# Patient Record
Sex: Female | Born: 1975 | Race: White | Hispanic: No | Marital: Married | Smoking: Never smoker
Health system: Southern US, Community
[De-identification: ages and names within clinical notes are randomized; demographics above are authoritative.]

## PROBLEM LIST (undated history)

## (undated) DIAGNOSIS — I10 Essential (primary) hypertension: Secondary | ICD-10-CM

---

## 2018-11-27 ENCOUNTER — Other Ambulatory Visit: Payer: Self-pay

## 2018-11-27 ENCOUNTER — Emergency Department (INDEPENDENT_AMBULATORY_CARE_PROVIDER_SITE_OTHER)
Admission: EM | Admit: 2018-11-27 | Discharge: 2018-11-27 | Disposition: A | Discharge status: Home / Self Care | Payer: No Typology Code available for payment source | Source: Home / Self Care | Attending: Family Medicine | Admitting: Family Medicine

## 2018-11-27 ENCOUNTER — Emergency Department (INDEPENDENT_AMBULATORY_CARE_PROVIDER_SITE_OTHER): Payer: No Typology Code available for payment source

## 2018-11-27 ENCOUNTER — Encounter: Payer: Self-pay | Admitting: Emergency Medicine

## 2018-11-27 DIAGNOSIS — M25562 Pain in left knee: Secondary | ICD-10-CM

## 2018-11-27 DIAGNOSIS — S62112A Displaced fracture of triquetrum [cuneiform] bone, left wrist, initial encounter for closed fracture: Secondary | ICD-10-CM

## 2018-11-27 DIAGNOSIS — S8002XA Contusion of left knee, initial encounter: Secondary | ICD-10-CM

## 2018-11-27 HISTORY — DX: Essential (primary) hypertension: I10

## 2018-11-27 MED ORDER — HYDROCODONE-ACETAMINOPHEN 5-325 MG PO TABS: 1.0000 | ORAL_TABLET | Freq: Four times a day (QID) | ORAL | 0 refills | Status: DC | PRN

## 2018-11-27 NOTE — ED Triage Notes (Signed)
Left wrist, left knee

## 2018-11-27 NOTE — Discharge Instructions (Addendum)
Wear ace wrap on left knee.  Wear left wrist brace. Elevate left wrist when possible.   Apply ice pack for 20 to 30 minutes, 3 to 4 times daily  Continue until pain and swelling decrease.

## 2018-11-27 NOTE — ED Provider Notes (Signed)
[facility]    CSN: [id number] Arrival date & time: 11/27/18  0945      History   Chief Complaint Chief Complaint  Patient presents with   Fall    HPI Debra Simmons is a 43 y.o. female.   While speed skating yesterday, patient fell, landing on her left anterior knee and bracing with her left wrist.  She has had persistent left wrist pain and decreased range of motion, and improving pain in her knee.  The history is provided by the patient.  Wrist Pain This is a new problem. The current episode started yesterday. The problem occurs constantly. The problem has been gradually worsening. Associated symptoms comments: Soreness left anterior knee.. Exacerbated by: wrist movement. Nothing relieves the symptoms. She has tried nothing (Velcro wrist brace) for the symptoms. The treatment provided mild relief.    Past Medical History:  Diagnosis Date   Hypertension     There are no active problems to display for this patient.   History reviewed. No pertinent surgical history.  OB History   No obstetric history on file.      Home Medications    Prior to Admission medications   Medication Sig Start Date End Date Taking? Authorizing Provider  HYDROcodone-acetaminophen (NORCO/VICODIN) 5-325 MG tablet Take 1 tablet by mouth every 6 (six) hours as needed for moderate pain or severe pain. 11/27/18   Kandra Nicolas, MD    Family History Family History  Problem Relation Age of Onset   Hypertension Mother    Hypertension Father     Social History Social History   Tobacco Use   Smoking status: Never Smoker   Smokeless tobacco: Never Used  Substance Use Topics   Alcohol use: Yes   Drug use: Not on file     Allergies   Patient has no known allergies.   Review of Systems Review of Systems  Constitutional: Negative.   Musculoskeletal: Positive for joint swelling. Negative for back pain and neck pain.  All other systems reviewed and are  negative.    Physical Exam Triage Vital Signs ED Triage Vitals [11/27/18 1108]  Enc Vitals Group     BP 131/82     Pulse Rate 73     Resp      Temp 98.8 F (37.1 C)     Temp Source Oral     SpO2 99 %     Weight 180 lb (81.6 kg)     Height 5\' 1"  (1.549 m)     Head Circumference      Peak Flow      Pain Score 8     Pain Loc      Pain Edu?      Excl. in [place]?    No data found.  Updated Vital Signs BP 131/82 (BP Location: Right Arm)    Pulse 73    Temp 98.8 F (37.1 C) (Oral)    Ht 5\' 1"  (1.549 m)    Wt 81.6 kg    SpO2 99%    BMI 34.01 kg/m   Visual Acuity Right Eye Distance:   Left Eye Distance:   Bilateral Distance:    Right Eye Near:   Left Eye Near:    Bilateral Near:     Physical Exam Vitals signs and nursing note reviewed.  Constitutional:      General: She is not in acute distress.    Appearance: She is obese.  HENT:     Head: Atraumatic.  Nose: Nose normal.  Eyes:     Pupils: Pupils are equal, round, and reactive to light.  Neck:     Musculoskeletal: Normal range of motion.  Cardiovascular:     Rate and Rhythm: Normal rate.  Pulmonary:     Effort: Pulmonary effort is normal.  Musculoskeletal:     Left knee: She exhibits normal range of motion, no swelling, no effusion, no ecchymosis, no deformity, no laceration, no erythema, normal alignment and no LCL laxity. Tenderness found.     Left hand: She exhibits decreased range of motion, tenderness, bony tenderness and swelling. She exhibits normal two-point discrimination, normal capillary refill, no deformity and no laceration. Normal sensation noted.       Hands:     Right lower leg: No edema.     Left lower leg: No edema.       Legs:     Comments: Left wrist has dorsal tenderness to palpation as noted on diagram.   Left knee:  No effusion, erythema, or warmth.  Knee stable, negative drawer test.  McMurray test negative. There is mild tenderness to palpation over inferior patella as noted on  diagram.   Skin:    General: Skin is warm and dry.  Neurological:     Mental Status: She is alert.      UC Treatments / Results  Labs (all labs ordered are listed, but only abnormal results are displayed) Labs Reviewed - No data to display  EKG   Radiology No results found.  Procedures Procedures (including critical care time)  Medications Ordered in UC Medications - No data to display  Initial Impression / Assessment and Plan / UC Course  I have reviewed the triage vital signs and the nursing notes.  Pertinent labs & imaging results that were available during my care of the patient were reviewed by me and considered in my medical decision making (see chart for details).    Ace wrap applied to left knee.  Patient's velcro wrist splint appears adequate. Schedule appt with Dr. Rodney Simmons in one week for fracture management. Rx for Lortab (#10, no refill). Controlled Substance Prescriptions I have consulted the [place] Controlled Substances Registry for this patient, and feel the risk/benefit ratio today is favorable for proceeding with this prescription for a controlled substance.    Final Clinical Impressions(s) / UC Diagnoses   Final diagnoses:  Contusion of left knee, initial encounter  Closed displaced fracture of triquetrum of left wrist, initial encounter     Discharge Instructions     Wear ace wrap on left knee.  Wear left wrist brace. Elevate left wrist when possible.   Apply ice pack for 20 to 30 minutes, 3 to 4 times daily  Continue until pain and swelling decrease.      ED Prescriptions    Medication Sig Dispense Auth. Provider   HYDROcodone-acetaminophen (NORCO/VICODIN) 5-325 MG tablet Take 1 tablet by mouth every 6 (six) hours as needed for moderate pain or severe pain. 10 tablet Lattie Haw, MD        Lattie Haw, MD 12/01/18 [id number]

## 2018-12-03 ENCOUNTER — Telehealth: Payer: Self-pay

## 2018-12-03 NOTE — Telephone Encounter (Signed)
Pt called and said that arm is still hurting.  Pt advised to follow up with Dr T for further treatment of fracture.  Pt has an appointment tomorrow.

## 2018-12-04 ENCOUNTER — Ambulatory Visit (INDEPENDENT_AMBULATORY_CARE_PROVIDER_SITE_OTHER): Payer: No Typology Code available for payment source | Admitting: Sports Medicine

## 2018-12-04 ENCOUNTER — Ambulatory Visit (INDEPENDENT_AMBULATORY_CARE_PROVIDER_SITE_OTHER): Payer: No Typology Code available for payment source

## 2018-12-04 ENCOUNTER — Other Ambulatory Visit: Payer: Self-pay

## 2018-12-04 ENCOUNTER — Encounter: Payer: Self-pay | Admitting: Sports Medicine

## 2018-12-04 DIAGNOSIS — S62115A Nondisplaced fracture of triquetrum [cuneiform] bone, left wrist, initial encounter for closed fracture: Secondary | ICD-10-CM

## 2018-12-04 DIAGNOSIS — S62112A Displaced fracture of triquetrum [cuneiform] bone, left wrist, initial encounter for closed fracture: Secondary | ICD-10-CM | POA: Insufficient documentation

## 2018-12-04 MED ORDER — OXYCODONE-ACETAMINOPHEN 5-325 MG PO TABS: 1.0000 | ORAL_TABLET | Freq: Three times a day (TID) | ORAL | 0 refills | Status: DC | PRN

## 2018-12-04 NOTE — Progress Notes (Signed)
Subjective:    CC: Wrist fracture  HPI:  This is a pleasant 43 year old female, she recently took a misstep and had a fall, she had immediate pain, swelling over her dorsal left wrist, pain is moderate, persistent, localized without radiation, she was seen in urgent care, x-rays showed an avulsion fracture from the dorsal triquetrum, she was appropriately placed in a Velcro brace and referred to me for further evaluation and definitive treatment.  I reviewed the past medical history, family history, social history, surgical history, and allergies today and no changes were needed.  Please see the problem list section below in epic for further details.  Past Medical History: Past Medical History:  Diagnosis Date  . Hypertension    Past Surgical History: No past surgical history on file. Social History: Social History   Socioeconomic History  . Marital status: Married    Spouse name: Not on file  . Number of children: Not on file  . Years of education: Not on file  . Highest education level: Not on file  Occupational History  . Not on file  Social Needs  . Financial resource strain: Not on file  . Food insecurity    Worry: Not on file    Inability: Not on file  . Transportation needs    Medical: Not on file    Non-medical: Not on file  Tobacco Use  . Smoking status: Never Smoker  . Smokeless tobacco: Never Used  Substance and Sexual Activity  . Alcohol use: Yes  . Drug use: Not on file  . Sexual activity: Not on file  Lifestyle  . Physical activity    Days per week: Not on file    Minutes per session: Not on file  . Stress: Not on file  Relationships  . Social Musician on phone: Not on file    Gets together: Not on file    Attends religious service: Not on file    Active member of club or organization: Not on file    Attends meetings of clubs or organizations: Not on file    Relationship status: Not on file  Other Topics Concern  . Not on file   Social History Narrative  . Not on file   Family History: Family History  Problem Relation Age of Onset  . Hypertension Mother   . Hypertension Father    Allergies: No Known Allergies Medications: See med rec.  Review of Systems: No headache, visual changes, nausea, vomiting, diarrhea, constipation, dizziness, abdominal pain, skin rash, fevers, chills, night sweats, swollen lymph nodes, weight loss, chest pain, body aches, joint swelling, muscle aches, shortness of breath, mood changes, visual or auditory hallucinations.  Objective:    General: Well Developed, well nourished, and in no acute distress.  Neuro: Alert and oriented x3, extra-ocular muscles intact, sensation grossly intact.  HEENT: Normocephalic, atraumatic, pupils equal round reactive to light, neck supple, no masses, no lymphadenopathy, thyroid nonpalpable.  Skin: Warm and dry, no rashes noted.  Cardiac: Regular rate and rhythm, no murmurs rubs or gallops.  Respiratory: Clear to auscultation bilaterally. Not using accessory muscles, speaking in full sentences.  Abdominal: Soft, nontender, nondistended, positive bowel sounds, no masses, no organomegaly.  Left wrist: ROM smooth and normal with good flexion and extension and ulnar/radial deviation that is symmetrical with opposite wrist. Tender to palpation over the dorsal radiocarpal joint, significant swelling and bruising. No snuffbox tenderness. No tenderness over Canal of [place]. Strength 5/5 in all directions without pain.  Negative tinel's and phalens signs. Negative Finkelstein sign. Negative Watson's test.  X-rays personally reviewed, there is a dorsal avulsion from the triquetrum.  Impression and Recommendations:    The patient was counselled, risk factors were discussed, anticipatory guidance given.  Fracture of triquetrum of left wrist Close dorsal avulsion fracture of the left triquetrum, 1 week ago. Still swollen so we will do another week and then  place a cast. Increasing to oxycodone. Light duty at work. Repeating x-rays today, she has some pain over the triquetrum but also more distally over the fourth and fifth metacarpal shafts.  I billed a fracture code for this encounter, all subsequent visits will be post-op checks in the global period.   ___________________________________________ Gwen Her. Dianah Field, M.D., ABFM., CAQSM. Primary Care and Sports Medicine [facility]  Adjunct Professor of [place] [employer]

## 2018-12-04 NOTE — Assessment & Plan Note (Addendum)
Close dorsal avulsion fracture of the left triquetrum, 1 week ago. Still swollen so we will do another week and then place a cast. Increasing to oxycodone. Light duty at work. Repeating x-rays today, she has some pain over the triquetrum but also more distally over the fourth and fifth metacarpal shafts.  I billed a fracture code for this encounter, all subsequent visits will be post-op checks in the global period.

## 2018-12-11 ENCOUNTER — Ambulatory Visit (INDEPENDENT_AMBULATORY_CARE_PROVIDER_SITE_OTHER): Payer: No Typology Code available for payment source | Admitting: Sports Medicine

## 2018-12-11 ENCOUNTER — Other Ambulatory Visit: Payer: Self-pay

## 2018-12-11 DIAGNOSIS — S62115D Nondisplaced fracture of triquetrum [cuneiform] bone, left wrist, subsequent encounter for fracture with routine healing: Secondary | ICD-10-CM | POA: Diagnosis not present

## 2018-12-11 NOTE — Assessment & Plan Note (Addendum)
Continue pain medications, light duty, short arm cast placed today. She still has a bit of paresthesias over her left knee and pain in her left thumb, nothing abnormal on the x-rays.  I advised her we can just watch this for now and that neurapraxias generally resolve. Return in 1 month for cast removal.

## 2018-12-11 NOTE — Progress Notes (Signed)
  Subjective: Debra Simmons returns, she pleasant 43 year old female with a left triquetral fracture, this occurred approximately 2 weeks ago.  She still has a bit of paresthesias over her left knee and pain in her left thumb, nothing abnormal on the x-rays.  I advised her we can just watch this for now and that neurapraxias generally resolve.  Objective: General: Well-developed, well-nourished, and in no acute distress. Left wrist: Swollen, still tender over the dorsal radiocarpal joint.  Short arm cast applied.  Assessment/plan:   Fracture of triquetrum of left wrist Continue pain medications, light duty, short arm cast placed today. She still has a bit of paresthesias over her left knee and pain in her left thumb, nothing abnormal on the x-rays.  I advised her we can just watch this for now and that neurapraxias generally resolve. Return in 1 month for cast removal.    ___________________________________________ Gwen Her. Dianah Field, M.D., ABFM., CAQSM. Primary Care and Sports Medicine [facility]  Adjunct Professor of [place] [employer] of Medicine

## 2018-12-30 ENCOUNTER — Ambulatory Visit (INDEPENDENT_AMBULATORY_CARE_PROVIDER_SITE_OTHER): Payer: No Typology Code available for payment source | Admitting: Sports Medicine

## 2018-12-30 ENCOUNTER — Other Ambulatory Visit: Payer: Self-pay

## 2018-12-30 ENCOUNTER — Telehealth: Payer: Self-pay | Admitting: Physician Assistant

## 2018-12-30 ENCOUNTER — Ambulatory Visit (INDEPENDENT_AMBULATORY_CARE_PROVIDER_SITE_OTHER): Payer: No Typology Code available for payment source

## 2018-12-30 DIAGNOSIS — S62115D Nondisplaced fracture of triquetrum [cuneiform] bone, left wrist, subsequent encounter for fracture with routine healing: Secondary | ICD-10-CM

## 2018-12-30 MED ORDER — OXYCODONE-ACETAMINOPHEN 10-325 MG PO TABS: 1.0000 | ORAL_TABLET | ORAL | 0 refills | Status: DC | PRN

## 2018-12-30 NOTE — Telephone Encounter (Signed)
Patient stopped by after she had an appointment this afternoon and stated that you had mentioned giving her a stronger pain medication and states she said no at first but now she is re-thinking that and would like to see if she could get prescribed something stronger. Please Advise.

## 2018-12-30 NOTE — Telephone Encounter (Signed)
No problem, switching to oxycodone tens

## 2018-12-30 NOTE — Progress Notes (Signed)
  Subjective: Worsening of wrist pain, I placed [place] in a short arm cast approximately 3 weeks ago, she had a fracture of her triquetrum.  She was doing well until she went to raise her arm, she had immediate increase in pain, as well as swelling.  Her fingers are now tingling.  Objective: General: Well-developed, well-nourished, and in no acute distress. Left wrist: Does appear more swollen than at the last visit, fingers are somewhat tense.  The cast was valved.  X-rays personally reviewed and show persistence of an ununited triquetral fracture.   Only minimally displaced  Assessment/plan:   Fracture of triquetrum of left wrist 3 weeks post fracture, severe worsening of swelling, pain. Neurovascularly intact distally. Cast valved. MRI and x-ray today.    ___________________________________________ Gwen Her. Dianah Field, M.D., ABFM., CAQSM. Primary Care and Sports Medicine [facility]  Adjunct Professor of [place] [employer] [facility] [facility] [employer]

## 2018-12-30 NOTE — Assessment & Plan Note (Addendum)
3 weeks post fracture, severe worsening of swelling, pain. Neurovascularly intact distally. Cast valved. MRI and x-ray today.

## 2019-01-01 ENCOUNTER — Ambulatory Visit (INDEPENDENT_AMBULATORY_CARE_PROVIDER_SITE_OTHER): Payer: No Typology Code available for payment source | Admitting: Sports Medicine

## 2019-01-01 ENCOUNTER — Other Ambulatory Visit: Payer: Self-pay

## 2019-01-01 ENCOUNTER — Encounter: Payer: Self-pay | Admitting: Sports Medicine

## 2019-01-01 DIAGNOSIS — S62115D Nondisplaced fracture of triquetrum [cuneiform] bone, left wrist, subsequent encounter for fracture with routine healing: Secondary | ICD-10-CM

## 2019-01-01 DIAGNOSIS — M8580 Other specified disorders of bone density and structure, unspecified site: Secondary | ICD-10-CM | POA: Diagnosis not present

## 2019-01-01 NOTE — Assessment & Plan Note (Addendum)
Noted in the lunate incidentally on MRI, she does have some copious inflammation, synovitis. Adding a full panel of rheumatoid testing. On further questioning she does have significant polyarthralgias, morning stiffness.

## 2019-01-01 NOTE — Progress Notes (Signed)
Subjective:    CC: Recheck left wrist  HPI: This is a pleasant 43 year old female, she has a known triquetral fracture, she had a reinjury so we obtained an MRI, the results of which we dictated below.  Pain is severe, persistent, localized without radiation.  On further questioning she has moderate polyarthralgias, achiness, morning stiffness.  I reviewed the past medical history, family history, social history, surgical history, and allergies today and no changes were needed.  Please see the problem list section below in epic for further details.  Past Medical History: Past Medical History:  Diagnosis Date   Hypertension    Past Surgical History: No past surgical history on file. Social History: Social History   Socioeconomic History   Marital status: Married    Spouse name: Not on file   Number of children: Not on file   Years of education: Not on file   Highest education level: Not on file  Occupational History   Not on file  Tobacco Use   Smoking status: Never Smoker   Smokeless tobacco: Never Used  Substance and Sexual Activity   Alcohol use: Yes   Drug use: Not on file   Sexual activity: Not on file  Other Topics Concern   Not on file  Social History Narrative   Not on file   Social Determinants of Health   Financial Resource Strain:    Difficulty of Paying Living Expenses: Not on file  Food Insecurity:    Worried About [place] in the Last Year: Not on file   Ran Out of Food in the Last Year: Not on file  Transportation Needs:    Lack of Transportation (Medical): Not on file   Lack of Transportation (Non-Medical): Not on file  Physical Activity:    Days of Exercise per Week: Not on file   Minutes of Exercise per Session: Not on file  Stress:    Feeling of Stress : Not on file  Social Connections:    Frequency of Communication with Friends and Family: Not on file   Frequency of Social Gatherings with Friends and  Family: Not on file   Attends Religious Services: Not on file   Active Member of Clubs or Organizations: Not on file   Attends Archivist Meetings: Not on file   Marital Status: Not on file   Family History: Family History  Problem Relation Age of Onset   Hypertension Mother    Hypertension Father    Allergies: No Known Allergies Medications: See med rec.  Review of Systems: No fevers, chills, night sweats, weight loss, chest pain, or shortness of breath.   Objective:    General: Well Developed, well nourished, and in no acute distress.  Neuro: Alert and oriented x3, extra-ocular muscles intact, sensation grossly intact.  HEENT: Normocephalic, atraumatic, pupils equal round reactive to light, neck supple, no masses, no lymphadenopathy, thyroid nonpalpable.  Skin: Warm and dry, no rashes. Cardiac: Regular rate and rhythm, no murmurs rubs or gallops, no lower extremity edema.  Respiratory: Clear to auscultation bilaterally. Not using accessory muscles, speaking in full sentences. Left wrist: Swollen and tender.  MRI shows expected triquetral fracture, no additional displacement, she also has a periarticular erosion in the lunate, as well as extensive synovitis concerning for inflammatory arthropathy.  Short arm Exos cast placed.  Impression and Recommendations:    Fracture of triquetrum of left wrist 3 weeks post fracture, she had a reinjury, MRI shows stability of the fracture. We  are transitioning into an Exos cast.  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 Adding a full panel of rheumatoid testing.   ___________________________________________ Ihor Austin. Benjamin Stain, M.D., ABFM., CAQSM. Primary Care and Sports Medicine [place] [facility]  Adjunct Professor of Family Medicine  [employer]

## 2019-01-01 NOTE — Assessment & Plan Note (Signed)
3 weeks post fracture, she had a reinjury, MRI shows stability of the fracture. We are transitioning into an Exos cast.

## 2019-01-08 ENCOUNTER — Ambulatory Visit: Payer: No Typology Code available for payment source | Admitting: Sports Medicine

## 2019-01-08 LAB — ANALYZER(R)ANA IFA WITH REFLEX TITER/PATTRN,SYS AUTOIMM PNL1
14-3-3 eta Protein: <0.2 ng/mL (ref <0.2)
Anti Nuclear Antibody (ANA): NEGATIVE
Anticardiolipin IgA: <11 [APL'U]
Anticardiolipin IgG: <14 [GPL'U]
Anticardiolipin IgM: <12 [MPL'U]
Beta-2 Glyco 1 IgA: <9 SAU (ref <=20)
Beta-2 Glyco 1 IgM: <9 SMU (ref <=20)
Beta-2 Glyco I IgG: <9 SGU (ref <=20)
C3 Complement: 167 mg/dL (ref 83–193)
C4 Complement: 31 mg/dL (ref 15–57)
Centromere Ab Screen: <1 AI
Chromatin (Nucleosomal) Antibody: <1 AI
Cyclic Citrullin Peptide Ab: <16 Units
DNA Ab (DS) Crithidia, IFA: NEGATIVE
ENA SM Ab Ser-aCnc: <1 AI
Jo-1 Autoabs: <1 AI
Ribonucleic Protein(ENA) Antibody, IgG: <1 AI
SM/RNP: <1 AI
SSA (Ro) (ENA) Antibody, IgG: <1 AI
SSB (La) (ENA) Antibody, IgG: <1 AI
Scleroderma (Scl-70) (ENA) Antibody, IgG: <1 AI
Thyroperoxidase Ab SerPl-aCnc: 2 IU/mL (ref <9)

## 2019-01-15 ENCOUNTER — Ambulatory Visit (INDEPENDENT_AMBULATORY_CARE_PROVIDER_SITE_OTHER): Payer: No Typology Code available for payment source | Admitting: Sports Medicine

## 2019-01-15 ENCOUNTER — Other Ambulatory Visit: Payer: Self-pay

## 2019-01-15 DIAGNOSIS — M8580 Other specified disorders of bone density and structure, unspecified site: Secondary | ICD-10-CM

## 2019-01-15 DIAGNOSIS — S62115D Nondisplaced fracture of triquetrum [cuneiform] bone, left wrist, subsequent encounter for fracture with routine healing: Secondary | ICD-10-CM

## 2019-01-15 MED ORDER — OXYCODONE-ACETAMINOPHEN 5-325 MG PO TABS: 1.0000 | ORAL_TABLET | Freq: Three times a day (TID) | ORAL | 0 refills | Status: AC | PRN

## 2019-01-15 MED ORDER — PREDNISONE 50 MG PO TABS: ORAL_TABLET | ORAL | 0 refills | Status: AC

## 2019-01-15 MED ORDER — MELOXICAM 15 MG PO TABS: ORAL_TABLET | ORAL | 3 refills | Status: AC

## 2019-01-15 NOTE — Assessment & Plan Note (Signed)
Debra Simmons returns, she still has significant pain in her wrist, but mostly at the radial aspect. I think the triquetral fracture is healing, she has significantly less tenderness, she has now been in immobilization for 5 weeks. We will continue to watch this.

## 2019-01-15 NOTE — Progress Notes (Signed)
    Procedures performed today:    None.  Independent interpretation of tests performed by another provider:   None.  Impression and Recommendations:    Fracture of triquetrum of left wrist Debra Simmons returns, she still has significant pain in her wrist, but mostly at the radial aspect. I think the triquetral fracture is healing, she has significantly less tenderness, she has now been in immobilization for 5 weeks. We will continue to watch this.   Erosion of bone We incidentally noted an erosion on the lunate on the MRI, also with copious synovitis. I did a full panel of rheumatoid testing, her CCP was negative, ANA as well as follow-up antibodies were also negative. Rheumatoid factor was canceled but rheumatoid factor is not very sensitive for rheumatoid arthritis itself. Because she still has severe pain, swelling, warmth we are going to proceed with a burst of prednisone though this may delay her fracture healing by a week or 2. Often times we will see patients with this presentation with seronegative rheumatoid arthritis. We will then chase the prednisone with meloxicam.     ___________________________________________ Ihor Austin. Benjamin Stain, M.D., ABFM., CAQSM. Primary Care and Sports Medicine [place] [facility]  Adjunct Instructor of Family Medicine  [employer]

## 2019-01-15 NOTE — Assessment & Plan Note (Addendum)
We incidentally noted an erosion on the lunate on the MRI, also with copious synovitis. I did a full panel of rheumatoid testing, her CCP was negative, ANA as well as follow-up antibodies were also negative. Rheumatoid factor was canceled but rheumatoid factor is not very sensitive for rheumatoid arthritis itself. Because she still has severe pain, swelling, warmth we are going to proceed with a burst of prednisone though this may delay her fracture healing by a week or 2. Often times we will see patients with this presentation with seronegative rheumatoid arthritis. We will then chase the prednisone with meloxicam.

## 2019-01-27 ENCOUNTER — Ambulatory Visit: Payer: No Typology Code available for payment source | Admitting: Sports Medicine

## 2019-02-03 ENCOUNTER — Ambulatory Visit: Payer: No Typology Code available for payment source | Admitting: Sports Medicine

## 2020-11-19 IMAGING — DX DG WRIST COMPLETE 3+V*L*
4 series · 4 of 4 positions shown · non-contrast
Comparison: None.

CLINICAL DATA: Left wrist pain since a fall skating last week.
Initial encounter.

EXAM:
LEFT WRIST - COMPLETE 3+ VIEW

[wrist pa]
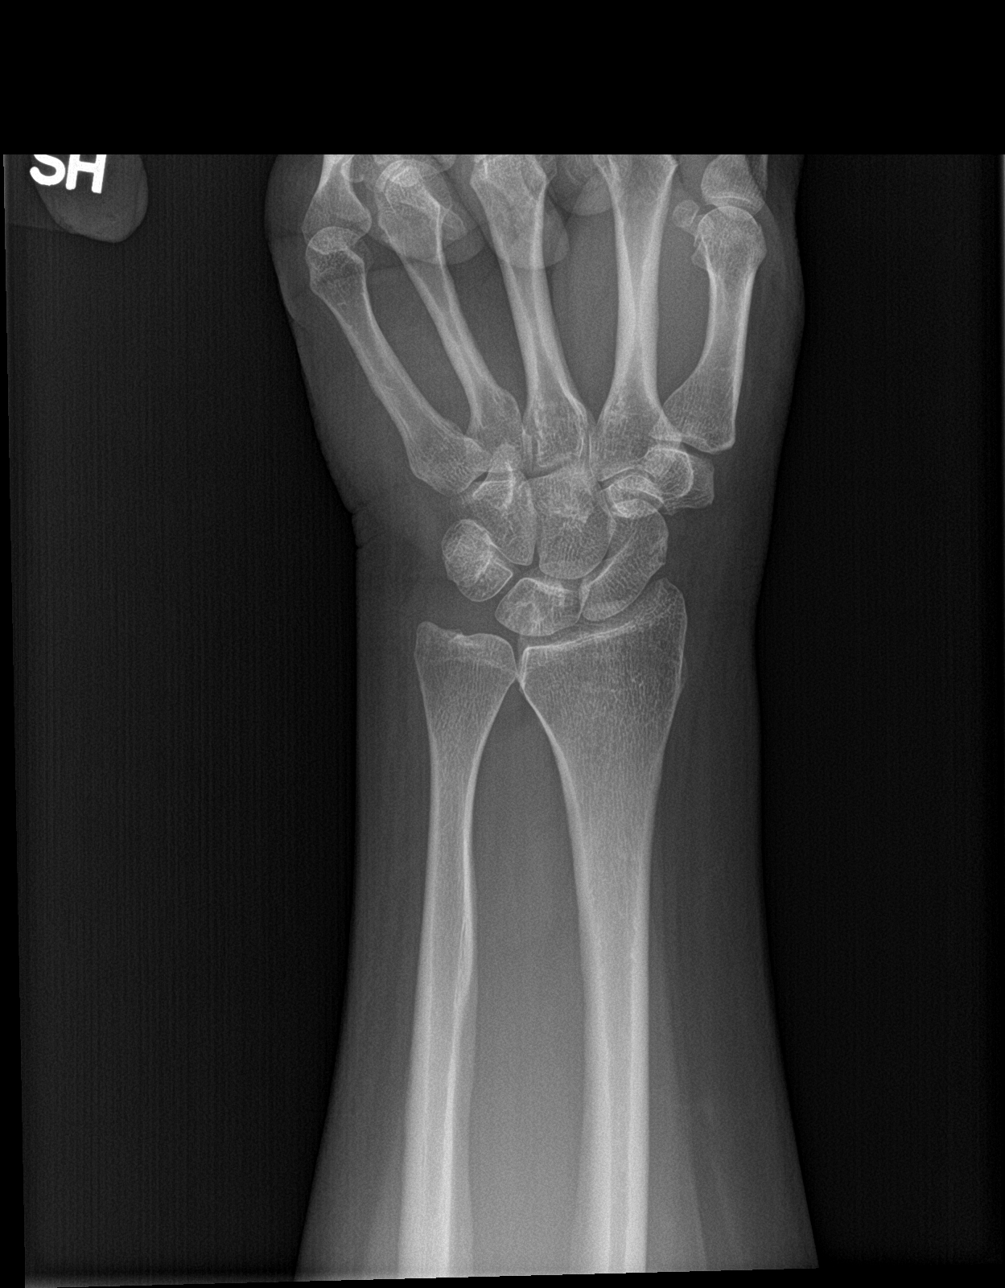

[wrist obl]
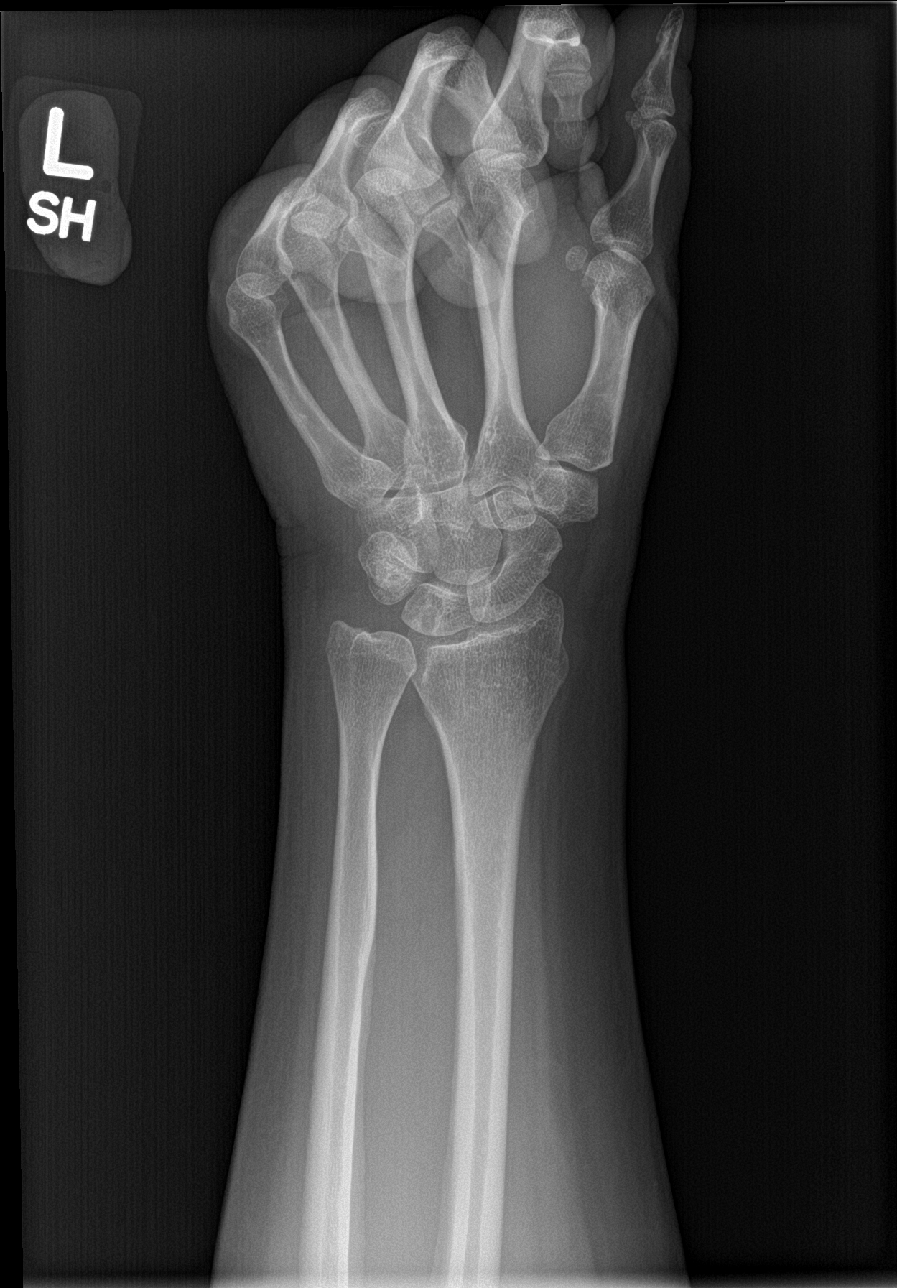

[wrist lat]
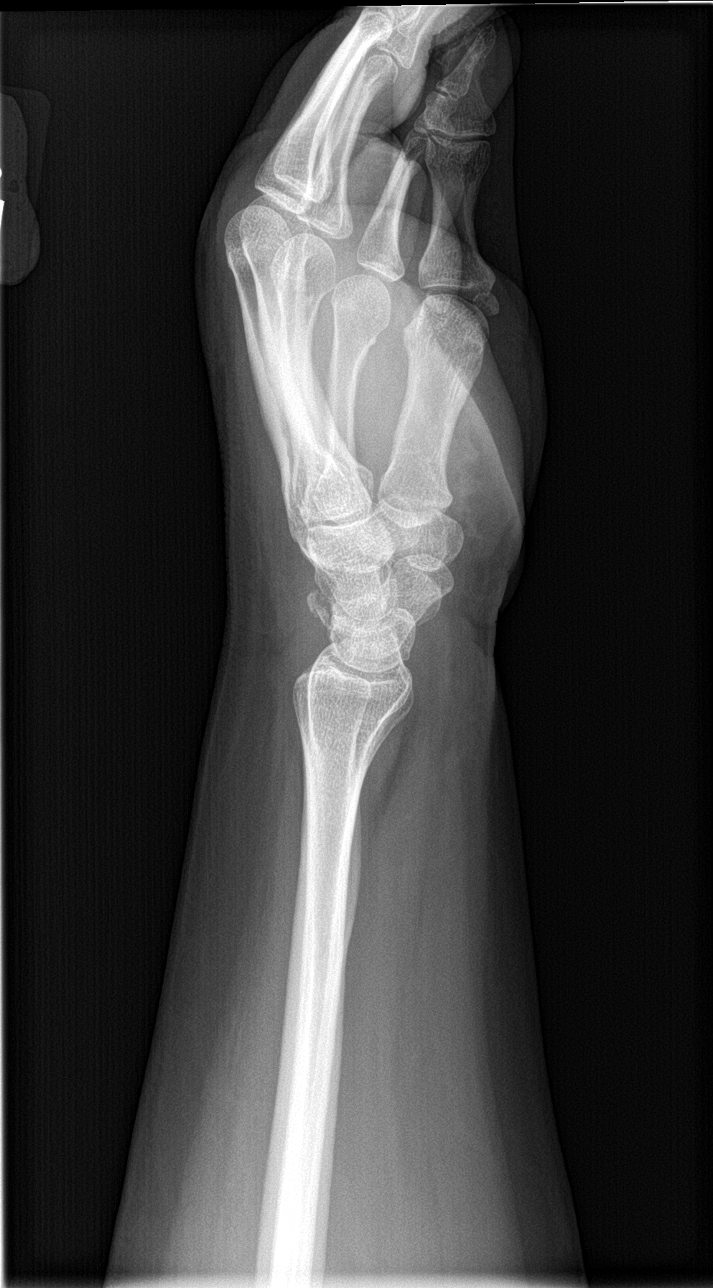

[wrist navicular]
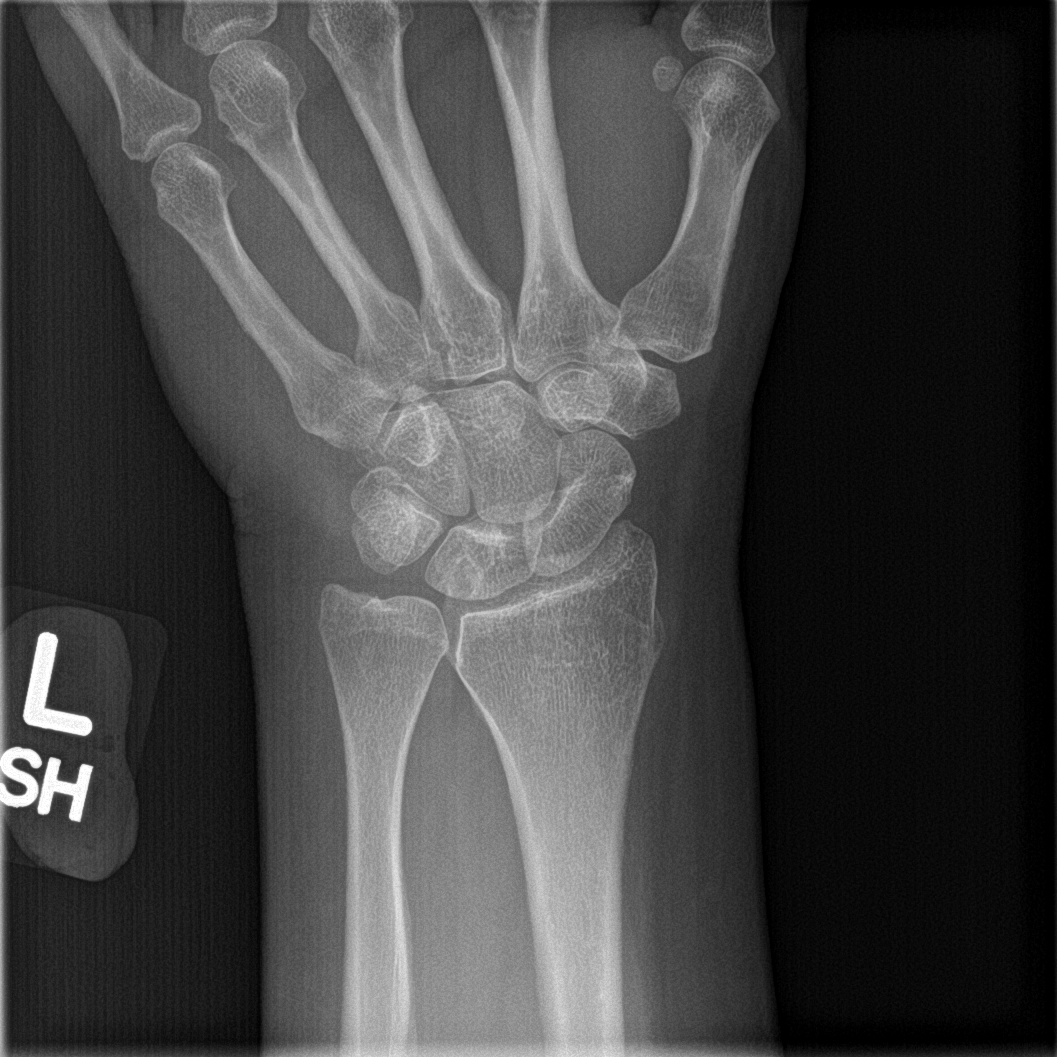

[4 of 4 positions shown; findings below may reference images not displayed]

FINDINGS: The patient has a fracture of the dorsal aspect of the triquetrum
seen on the lateral view. The fracture is minimally displaced. No
other acute bony or joint abnormality is identified.
IMPRESSION: Acute minimally displaced fracture of the triquetrum.

## 2020-11-26 IMAGING — DX DG WRIST COMPLETE 3+V*L*
4 series · 4 of 4 positions shown · non-contrast
Comparison: 11/27/2018

CLINICAL DATA: Follow-up fracture

EXAM:
LEFT WRIST - COMPLETE 3+ VIEW

[wrist pa]
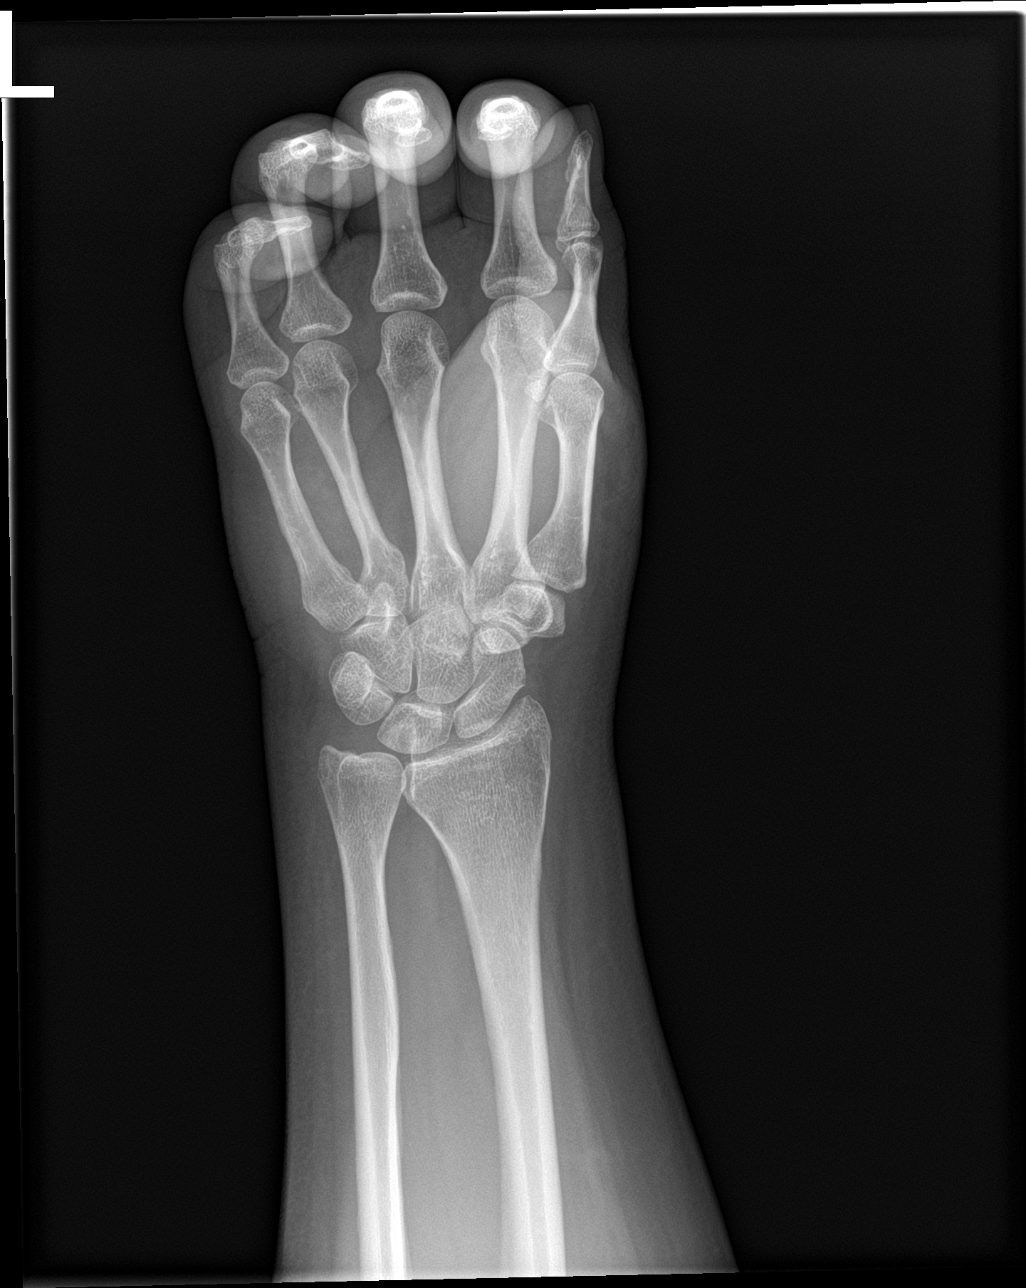

[wrist obl]
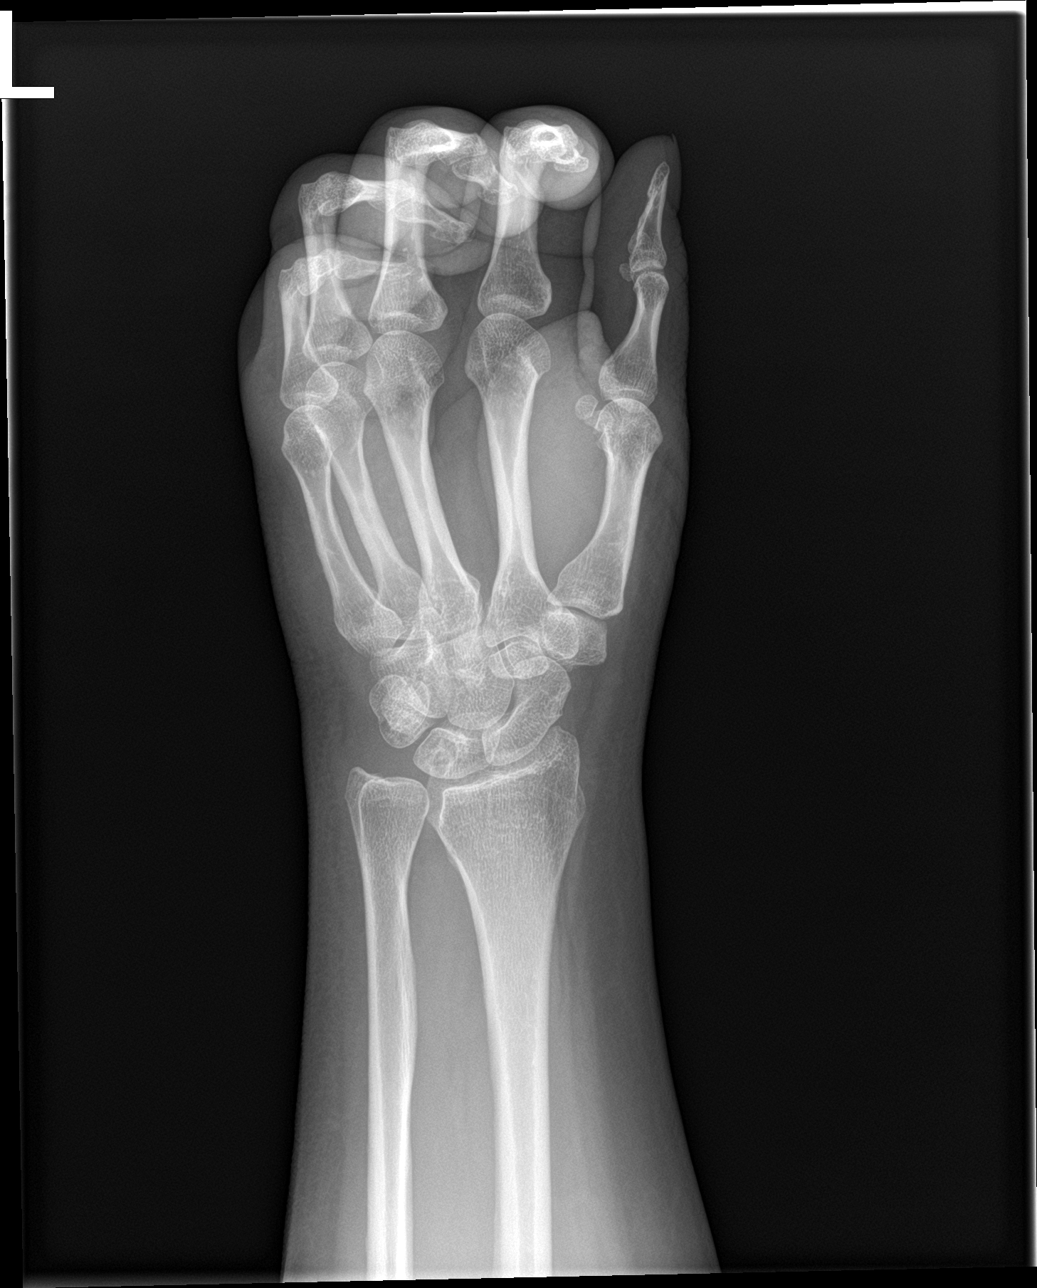

[wrist lat]
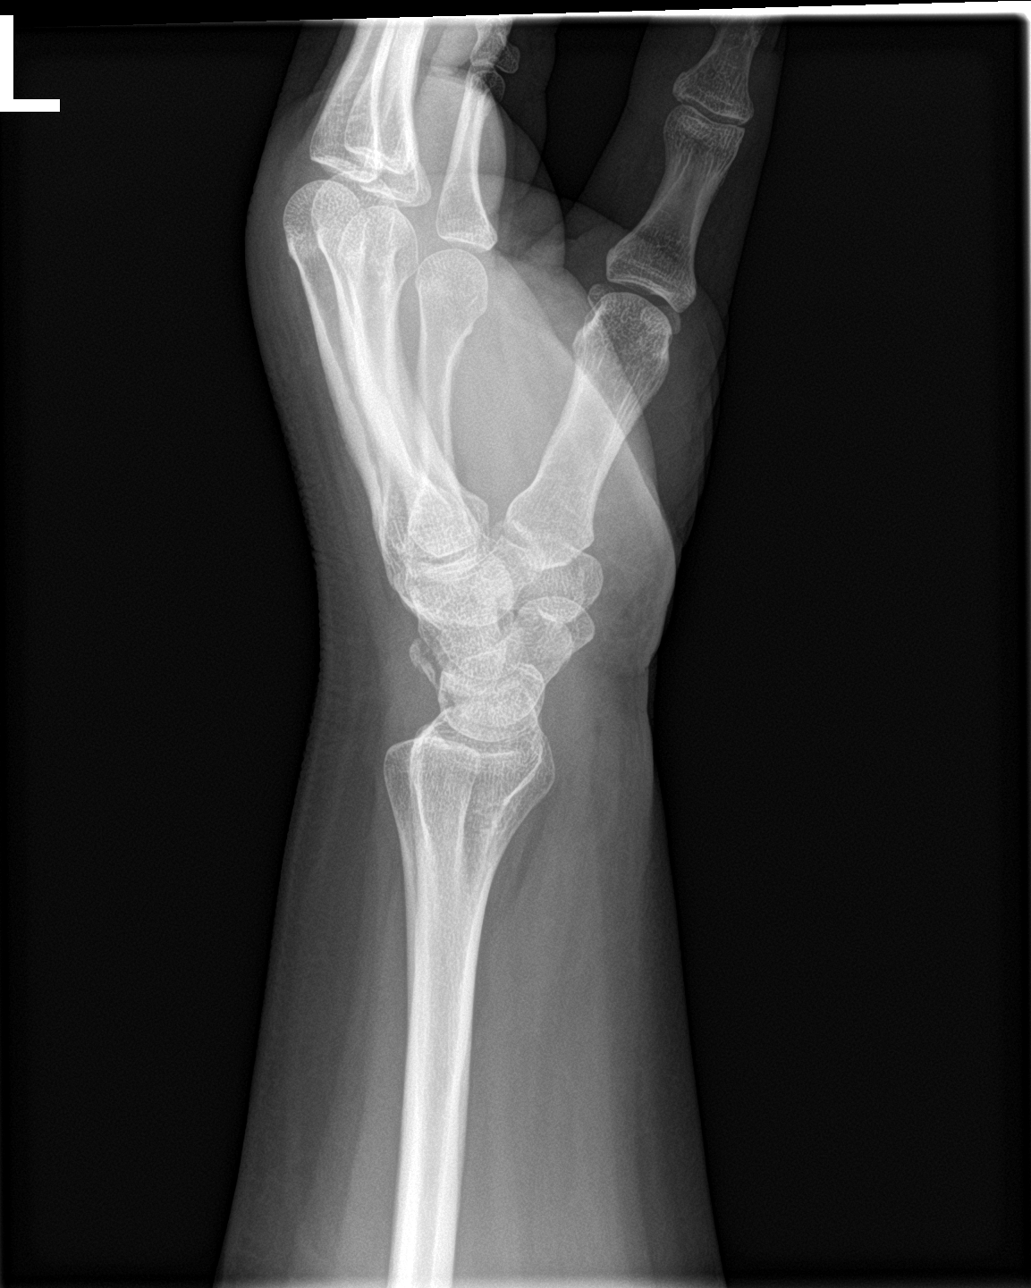

[wrist navicular]
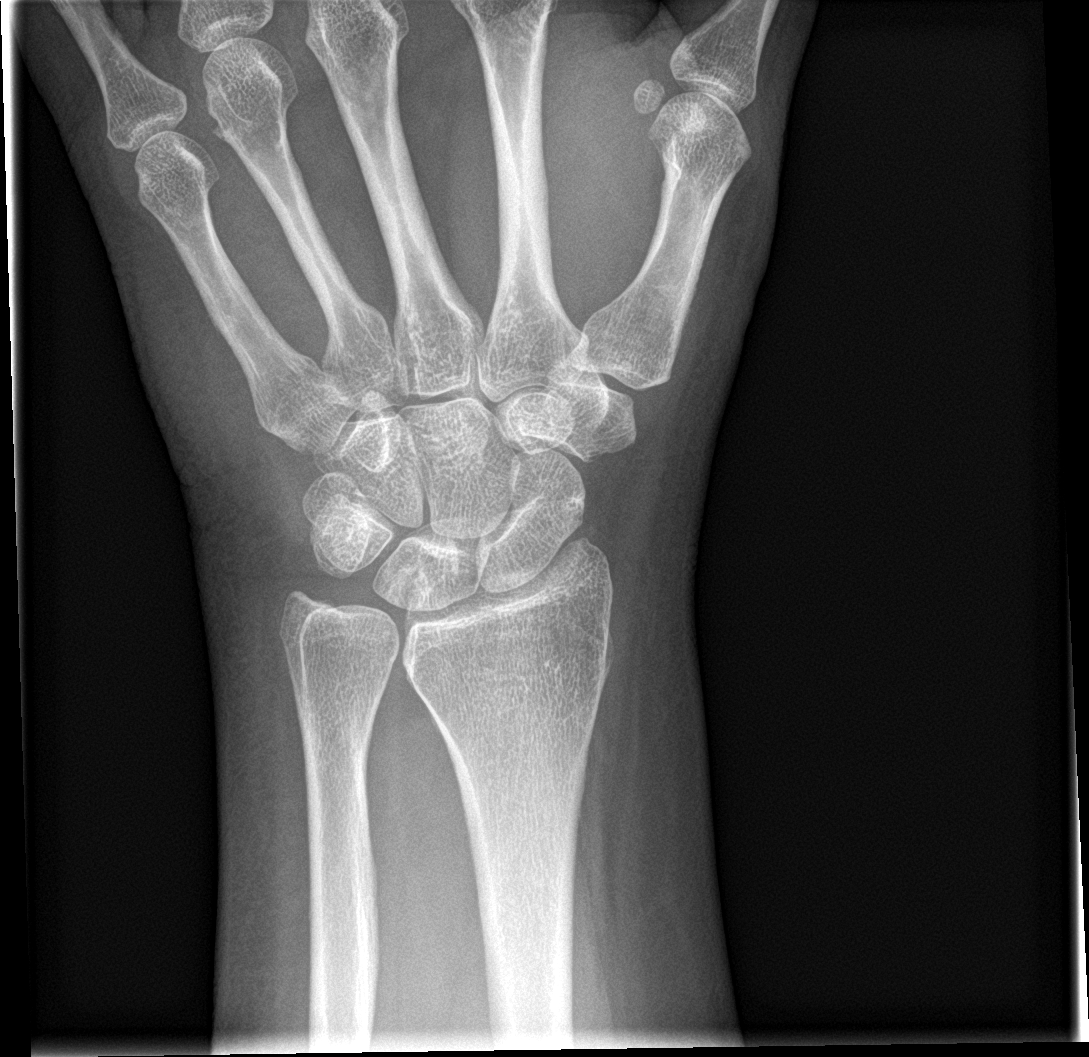

[4 of 4 positions shown; findings below may reference images not displayed]

FINDINGS: No significant change in alignment of the mildly displaced
triquetral fracture. No significant osseous bridging. No new
abnormality.
IMPRESSION: Stable alignment of the mildly displaced triquetral fracture
